# Patient Record
Sex: Male | Born: 1980 | Race: White | Hispanic: No | Marital: Single
Health system: Midwestern US, Community
[De-identification: ages and names within clinical notes are randomized; demographics above are authoritative.]

## PROBLEM LIST (undated history)

## (undated) DIAGNOSIS — C73 Malignant neoplasm of thyroid gland: Secondary | ICD-10-CM

## (undated) DIAGNOSIS — E890 Postprocedural hypothyroidism: Secondary | ICD-10-CM

## (undated) HISTORY — PX: THYROID SURGERY: SHX805

---

## 2013-05-08 MED ORDER — LISINOPRIL 10 MG TAB
10 mg | ORAL_TABLET | Freq: Every day | ORAL | Status: DC
Start: 2013-05-08 — End: 2014-03-31

## 2013-05-09 NOTE — Progress Notes (Signed)
HISTORY OF PRESENT ILLNESS  Kevin Ellis is a 32 y.o. male.  Hypertension   The history is provided by the patient. This is a chronic problem. Episode onset: was dx with uncontrolled htn at the [facility] about 4 wks ago. The problem has not changed since onset.Associated symptoms include chest pain, palpitations, anxiety, headaches, peripheral edema, dizziness, nausea and vomiting. Pertinent negatives include no orthopnea, no PND, no confusion, no malaise/fatigue, no blurred vision, no neck pain and no shortness of breath. There are no associated agents to hypertension. Risk factors include family history, male gender and stress (x of substance abuse; claims to be clean for the past 7 mos;; going  thru rehab with suboxane).   Dizziness   The history is provided by the patient. This is a new problem. Episode onset: had had dizzines for the past 3 mo.last for 5-6 secs and clears spontaneously. Episode frequency: recurrent. The problem has not changed since onset.There was no loss of consciousness. The problem is associated with standing up, turning head, exertion and sitting up. Associated symptoms include chest pain, palpitations, nausea, vomiting, headaches and dizziness. Pertinent negatives include no clumsiness, no confusion, no diaphoresis, no fever, no malaise/fatigue, no abdominal pain, no bowel incontinence, no bladder incontinence, no congestion, no back pain, no seizures, no slurred speech, no weakness and no anal bleeding. He has tried nothing for the symptoms. The treatment provided no relief. His past medical history is significant for HTN. His past medical history does not include no CVA, no TIA, no CAD, no DM, no vertigo, no seizures, no syncope or no AICD. Past medical history comments: hx sunstance abuse.   Thyroid Problem  The history is provided by the patient. This is a chronic problem. Episode onset: since 2006 when he was found to hve CA OF THYROID GLANF; s/p partial thyroidectomy.  Associated symptoms include chest pain and headaches. Pertinent negatives include no abdominal pain and no shortness of breath. Associated symptoms comments: No energy, malaise. Exacerbated by: has had no meds or f/u  for 4-5 yrs. Nothing relieves the symptoms. He has tried nothing for the symptoms. The treatment provided no relief.   Headache  The history is provided by the patient. This is a chronic problem. Episode onset: HAS HAD MIGRAINE H/A AND TENSION H/A FOR A FEW YRS. Episode frequency: RECURRENT. The problem has not changed since onset.Associated symptoms include chest pain and headaches. Pertinent negatives include no abdominal pain and no shortness of breath. Associated symptoms comments: NAUSEA AND VOMITING;BLURRING OF VISION PHOTOPHOBIA, MALAISE. The symptoms are aggravated by stress and exertion. The symptoms are relieved by medications. Treatments tried: RX MEDS. The treatment provided moderate relief.   Addiction problem  Primary symptoms include: agitation.  There areno confusion, no somnolence, no loss of consciousness, no seizures, no weakness, no delusions, no hallucinations, no self-injury, no violence and no intoxication present at this time.  The history is provided by the patient. This is a chronic problem. Episode onset: 10 YR. 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 Associated symptoms include nausea and vomiting. Pertinent negatives include no fever, no bladder incontinence and no bowel incontinence. Associated symptoms comments: bein g followed up at the [facility] where he is given Suboxone Going thrU ANA. Associated medical issues include addiction treatment, withdrawal syndrome and depression. Associated medical issues  do not include mental illness, psychiatric history,  recent illness, recent infection, suicidal ideas, homicidal ideas and mental status change.   ED Follow-up  The history is provided by the patient. This is a new problem. Episode onset: SEEN AT THE ED 1 WK AGO FOR CHEST PAINS, HEADACHES  AND NUMBNESS OF THE FINGERS OF THE LEFT HAND. The problem has been resolved (sx cleared spontaneously after 1 day;work up at the ED incl ct of head and blood work were unremarkable). Associated symptoms include chest pain and headaches. Pertinent negatives include no abdominal pain and no shortness of breath. The symptoms are aggravated by stress. The symptoms are relieved by medications (AT THE ED). He has tried nothing for the symptoms. The treatment provided no relief.   Other  The history is provided by the patient. Chronicity: RECENT TEST AT THE [facility] SHOWED + FOR HEP C , NEG FOR HIV AND HEP C  ; HAD RECENT HEP B VACCINATION. The problem has not changed since onset.Associated symptoms include chest pain and headaches. Pertinent negatives include no abdominal pain and no shortness of breath. Associated symptoms comments: NEG FOR FEVERS , JAUNDICE;OR CONSTITUTIONAL SX. Nothing aggravates the symptoms. Nothing relieves the symptoms. He has tried nothing for the symptoms. The treatment provided no relief.       Review of Systems   Constitutional: Negative for fever, weight loss, malaise/fatigue and diaphoresis.   HENT: Negative for hearing loss, nosebleeds, congestion, sore throat, neck pain and ear discharge.    Eyes: Positive for photophobia. Negative for blurred vision, double vision, discharge and redness.   Respiratory: Negative for cough, hemoptysis, shortness of breath and wheezing.    Cardiovascular: Positive for chest pain and palpitations. Negative for orthopnea, claudication, leg swelling and PND.   Gastrointestinal: Positive for heartburn, nausea, vomiting and constipation. Negative for abdominal pain, diarrhea, anal bleeding and bowel incontinence.   Genitourinary:  Negative.  Negative for bladder incontinence.   Musculoskeletal: Negative for myalgias, back pain and joint pain.   Skin: Negative.    Neurological: Positive for dizziness and headaches. Negative for tingling, seizures, loss of consciousness, syncope and weakness.   Endo/Heme/Allergies: Positive for environmental allergies. Negative for polydipsia. Does not bruise/bleed easily.   Psychiatric/Behavioral: Positive for depression, substance abuse and agitation. Negative for suicidal ideas, homicidal ideas, hallucinations, confusion and self-injury. The patient is nervous/anxious.      Past Medical History   Diagnosis Date   ??? Thyroid cancer 2006     Past Surgical History   Procedure Laterality Date   ??? Hx partial thyroidectomy  2006     Family History   Problem Relation Age of Onset   ??? Diabetes Mother    ??? Heart Disease Father        Not on File  No current outpatient prescriptions on file prior to visit.     No current facility-administered medications on file prior to visit.     Patient Active Problem List   Diagnosis Code   ??? Thyroid ca 193   ??? Substance abuse 305.90       Filed Vitals:    05/08/13 1521   BP: 110/90   Pulse: 63   Temp: 98.1 ??F (36.7 ??C)   TempSrc: Oral   Resp: 20   Height: 6\' 1"  (1.854 m)   Weight: 179 lb (81.194 kg)   SpO2: 98%   PainSc:   0 - No pain     Body mass index is 23.62 kg/(m^2).  Physical Exam   Constitutional: He is oriented to person, place, and time. He appears well-developed and well-nourished. No distress.   HENT:   Head: Normocephalic and atraumatic.   Right Ear: External ear normal.   Left Ear: External ear normal.   Nose: Nose normal.   Mouth/Throat: No oropharyngeal exudate.   Eyes: Conjunctivae and EOM are normal. Pupils are equal, round, and reactive to light. Right eye exhibits no discharge. Left eye exhibits no discharge.   Neck: Normal range of motion. Neck supple. No JVD present. No tracheal deviation present. No thyromegaly present.   Cardiovascular: Normal rate,  regular rhythm, normal heart sounds and intact distal pulses.  Exam reveals no gallop and no friction rub.    No murmur heard.  Pulmonary/Chest: Effort normal and breath sounds normal. No stridor. No respiratory distress. He has no wheezes. He has no rales. He exhibits no tenderness.   Abdominal: Soft. Bowel sounds are normal. He exhibits no distension.   Musculoskeletal: Normal range of motion. He exhibits no edema and no tenderness.   Lymphadenopathy:     He has no cervical adenopathy.   Neurological: He is alert and oriented to person, place, and time. He has normal reflexes. He displays normal reflexes. No cranial nerve deficit. Coordination normal.   Skin: Skin is warm. No rash noted. No erythema.   Psychiatric: His behavior is normal. Judgment and thought content normal.   CURRR[facility]LY ON [facility] FROM [facility]; HX OF SUBSTANCE ABUSE BUT CLAIMS HE HAD BEEN CLEAN FOR AT LEAST 7 MOS;ANXOIUS WITH FLAT AFFECT          ASSESSMENT and PLAN  Encounter Diagnoses   Name Primary?   ??? Hypertension Yes   ??? Hyperlipidemia    ??? Thyroid ca    ??? Anemia    ??? Hepatitis B      Orders Placed This Encounter   ??? NM THYROID SCAN W OR WO FLOW   ??? LIPID PANEL   ??? METABOLIC PANEL, COMPREHENSIVE   ??? THYROID PANEL W/TSH   ??? CBC WITH AUTOMATED DIFF   ??? HEP B SURFACE AG   ??? HEPATITIS B CORE AB, TOTAL   ??? buprenorphine-naloxone (SUBOXONE) 8-2 mg subl   ??? lisinopril (PRINIVIL, ZESTRIL) 10 mg tablet     Follow-up Disposition:  Return in about 3 weeks (around 05/29/2013).  current treatment plan is effective, no change in therapy  lab results and schedule of future lab studies reviewed with patient  cardiovascular risk and specific lipid/LDL goals reviewed  reviewed medications and side effects in detail  radiology results and schedule of future radiology studies reviewed with patient

## 2013-05-31 LAB — METABOLIC PANEL, COMPREHENSIVE
A-G Ratio: 1.1 — ABNORMAL LOW (ref 1.2–2.2)
ALT (SGPT): 19 U/L (ref 12–78)
AST (SGOT): 20 U/L (ref 15–37)
Albumin: 4 g/dL (ref 3.4–5.0)
Alk. phosphatase: 61 U/L (ref 50–136)
Anion gap: 4 mmol/L — ABNORMAL LOW (ref 6–15)
BUN/Creatinine ratio: 13 (ref 7–25)
BUN: 12 MG/DL (ref 7–18)
Bilirubin, total: 0.6 MG/DL (ref <1.1)
CO2: 33 mmol/L — ABNORMAL HIGH (ref 21–32)
Calcium: 9.2 MG/DL (ref 8.5–10.1)
Chloride: 104 mmol/L (ref 98–107)
Creatinine: 0.9 MG/DL (ref 0.60–1.30)
GFR est AA: >60 mL/min/{1.73_m2} (ref >60)
GFR est non-AA: >60 mL/min/{1.73_m2} (ref >60)
Globulin: 3.7 g/dL — ABNORMAL HIGH (ref 2.4–3.5)
Glucose: 90 mg/dL (ref 70–110)
Potassium: 3.7 mmol/L (ref 3.5–5.3)
Protein, total: 7.7 g/dL (ref 6.4–8.2)
Sodium: 141 mmol/L (ref 136–145)

## 2013-05-31 LAB — HEP B SURFACE AG: Hep B Surface Ag: NONREACTIVE

## 2013-05-31 LAB — CBC WITH AUTOMATED DIFF
ABS. BASOPHILS: 0 10*3/uL (ref 0.0–0.1)
ABS. EOSINOPHILS: 0.1 10*3/uL (ref 0.0–0.5)
ABS. LYMPHOCYTES: 2.5 10*3/uL (ref 0.8–3.5)
ABS. MONOCYTES: 0.3 10*3/uL — ABNORMAL LOW (ref 0.8–3.5)
ABS. NEUTROPHILS: 1.9 10*3/uL (ref 1.5–8.0)
BASOPHILS: 0 % (ref 0–2)
EOSINOPHILS: 2 % (ref 0–5)
HCT: 39.1 % — ABNORMAL LOW (ref 41–53)
HGB: 13.3 g/dL — ABNORMAL LOW (ref 13.5–17.5)
LYMPHOCYTES: 53 % — ABNORMAL HIGH (ref 19–48)
MCH: 28.6 PG (ref 27–31)
MCHC: 34 g/dL (ref 31–37)
MCV: 84.1 FL (ref 78–98)
MONOCYTES: 5 % (ref 3–9)
MPV: 11 FL — ABNORMAL HIGH (ref 5.9–10.3)
NEUTROPHILS: 40 % (ref 40–74)
PLATELET: 201 10*3/uL (ref 130–400)
RBC: 4.65 M/uL — ABNORMAL LOW (ref 4.7–6.1)
RDW: 12.6 % (ref 11.5–14.5)
WBC: 4.8 10*3/uL (ref 4.5–10.8)

## 2013-05-31 LAB — THYROID PANEL W/TSH
Free thyroxine index: 2.4 (ref 1.4–5.2)
T3 Uptake: 36 % (ref 31–39)
T4, Total: 6.7 ug/dL (ref 4.7–13.3)
TSH: 3.9 u[IU]/mL — ABNORMAL HIGH (ref 0.35–3.74)

## 2013-05-31 LAB — LIPID PANEL
Cholesterol, total: 124 MG/DL (ref <200)
HDL Cholesterol: 40 MG/DL (ref 32–96)
LDL, calculated: 68.64 MG/DL (ref <130)
Triglyceride: 96 MG/DL (ref <150)
VLDL, calculated: 19.2 MG/DL (ref 5–32)

## 2013-05-31 NOTE — Progress Notes (Signed)
Kevin Ellis had labs drawn today per Dr. Doneta Public.

## 2013-06-01 LAB — HEPATITIS B CORE ANTIBODY, TOTAL: Hep B Core Total Ab: NEGATIVE

## 2013-06-01 LAB — HEPATITIS B CORE AB, TOTAL: Hep B Core Ab, total: NEGATIVE

## 2013-06-05 MED ORDER — AMLODIPINE 5 MG TAB
5 mg | ORAL_TABLET | Freq: Every day | ORAL | Status: DC
Start: 2013-06-05 — End: 2014-03-31

## 2013-06-05 MED ORDER — LEVOTHYROXINE 75 MCG TAB
75 mcg | ORAL_TABLET | Freq: Every day | ORAL | Status: DC
Start: 2013-06-05 — End: 2014-03-31

## 2013-06-05 MED ORDER — FERROUS SULFATE 325 MG (65 MG ELEMENTAL IRON) TAB
325 mg (65 mg iron) | ORAL_TABLET | Freq: Every day | ORAL | Status: DC
Start: 2013-06-05 — End: 2014-03-31

## 2013-06-06 NOTE — Progress Notes (Signed)
HISTORY OF PRESENT ILLNESS  Kevin Ellis is a 32 y.o. male.  HPI Comments: DISCUSSED BLOOD TESTS DONE ON 05/31/13- HEMATOLOGY - SL LOW HGB-13.3 GM AND HCT-39%;            CHEMISTRY- SERUM ELECTROLYTES-NORMAL, GLUCOSE, BUN/CREATININE, GFRNA-NORMAL;                  LIVER ENZYMES- NORMAL; LIPID PANEL- NORMAL;THYROID PROFILE SL LOW TSH    Results  Pertinent negatives include no chest pain, no abdominal pain, no headaches and no shortness of breath.   Hypertension   The history is provided by the patient. This is a chronic problem. Episode onset: HAS HAD HTN FOR A FEW YRS, Progression since onset: IN SPITE OF TAKING HIS MED REGULARLY, HIS BP HAD BEEN UNCONTROLLED. Associated symptoms include anxiety and malaise/fatigue. Pertinent negatives include no chest pain, no orthopnea, no palpitations, no PND, no confusion, no headaches, no tinnitus, no neck pain, no peripheral edema, no dizziness, no shortness of breath, no nausea and no vomiting. There are no associated agents to hypertension. Risk factors include male gender and stress (HJXOF SUBSTANCE ABUSE BUT CLAIMS TO BE CLEAN FOR  A FEW MOS. ; CURRENTLY ON DETOXIFICATION WITH SUBOXONE).   Addiction problem  Primary symptoms include: weakness and agitation.  There areno confusion, no somnolence, no loss of consciousness, no seizures, no delusions, no hallucinations, no violence and no intoxication present at this time.  The history is provided by the patient. This is a chronic problem. Episode onset: HAS HX OF SUBSTANCE ABUSE WITH NARCOTICS  AND ILLEGAL DRUGS  BUT  CURRENTLY ON COUNSELING AND DETOXIFICAITION AND ADMITS TO HAVE BEEN CLEAN FOR A FEW MOS. The problem has been gradually improving. Suspected agents include alcohol, hallucinogens, heroin, opiates, prescription drugs and crack. Pertinent negatives include no fever, no injury, no nausea, no vomiting, no bladder incontinence and no bowel incontinence. Associated medical issues include addiction treatment. Associated  medical issues do not include mental illness, psychiatric history, recent illness, recent infection, suicidal ideas, homicidal ideas, mental status change and depression.   Thyroid Problem  The history is provided by the patient. This is a chronic problem. 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 Episode frequency: HAS HAD RECURRENT  WEAKNESS, LETHARGY FOR A FEW MOS. The problem has not changed since onset.Pertinent negatives include no chest pain, no abdominal pain, no headaches and no shortness of breath. Associated symptoms comments: EASY FATIGABILITY, LETHARGY, COLD INTOLERANCE. The symptoms are aggravated by stress. Nothing relieves the symptoms. He has tried nothing for the symptoms. The treatment provided no relief.       Review of Systems   Constitutional: Positive for malaise/fatigue. Negative for fever, weight loss and diaphoresis.   HENT: Negative for hearing loss, congestion, sore throat, neck pain and tinnitus.    Eyes: Negative.    Respiratory: Negative for cough, hemoptysis, shortness of breath and wheezing.    Cardiovascular: Negative for chest pain, palpitations, orthopnea, claudication and PND.   Gastrointestinal: Positive for heartburn. Negative for nausea, vomiting, abdominal pain, diarrhea, constipation, blood in stool and bowel incontinence.   Genitourinary: Negative.  Negative for bladder incontinence.   Musculoskeletal: Negative for myalgias, back pain and joint pain.   Skin: Negative.    Neurological: Positive for weakness. Negative for dizziness, tingling, tremors, focal weakness, seizures, loss of consciousness and headaches.  Endo/Heme/Allergies: Negative for environmental allergies and polydipsia. Does not bruise/bleed easily.   Psychiatric/Behavioral: Positive for substance abuse and agitation. Negative for depression, suicidal ideas,  homicidal ideas, hallucinations and confusion. The patient is nervous/anxious. The patient does not have insomnia.         HX OF SUBSTANCE ABUSE; ON COUNSELING AND DETOXIFICATION WITH SUBOXONE     Past Medical History   Diagnosis Date   ??? Thyroid cancer 2006     Past Surgical History   Procedure Laterality Date   ??? Hx partial thyroidectomy  2006     Family History   Problem Relation Age of Onset   ??? Diabetes Mother    ??? Heart Disease Father        Not on File  Current Outpatient Prescriptions on File Prior to Visit   Medication Sig Dispense Refill   ??? buprenorphine-naloxone (SUBOXONE) 8-2 mg subl by SubLINGual route daily.       ??? lisinopril (PRINIVIL, ZESTRIL) 10 mg tablet Take 1 Tab by mouth daily.  30 Tab  1     No current facility-administered medications on file prior to visit.     Patient Active Problem List   Diagnosis Code   ??? Thyroid ca 193   ??? Substance abuse 305.90       Filed Vitals:    06/05/13 1520   BP: 120/90   Pulse: 58   Temp: 98.7 ??F (37.1 ??C)   TempSrc: Oral   Resp: 18   Height: 6\' 1"  (1.854 m)   Weight: 182 lb (82.555 kg)   SpO2: 98%   PainSc:   0 - No pain     Body mass index is 24.02 kg/(m^2).        Physical Exam   Constitutional: He is oriented to person, place, and time. He appears well-developed and well-nourished. No distress.   HENT:   Right Ear: External ear normal.   Left Ear: External ear normal.   Nose: Nose normal.   Mouth/Throat: No oropharyngeal exudate.   Eyes: Conjunctivae and EOM are normal. Pupils are equal, round, and reactive to light. Right eye exhibits no discharge. Left eye exhibits no discharge. No scleral icterus.   Neck: Normal range of motion. Neck supple. No JVD present. No tracheal deviation present. No thyromegaly present.   Cardiovascular: Normal rate, regular rhythm, normal heart sounds and intact distal pulses.  Exam reveals no gallop and no friction rub.    No murmur heard.  Pulmonary/Chest: Effort normal and breath sounds normal. No stridor. No respiratory  distress. He has no wheezes. He has no rales. He exhibits no tenderness.   Abdominal: Soft. Bowel sounds are normal. He exhibits no distension. There is no tenderness. There is no guarding.   Musculoskeletal: Normal range of motion. He exhibits no edema and no tenderness.   Lymphadenopathy:     He has no cervical adenopathy.   Neurological: He is alert and oriented to person, place, and time. He has normal reflexes. He displays normal reflexes. No cranial nerve deficit. He exhibits normal muscle tone. Coordination normal.   Skin: Skin is warm. No rash noted. No erythema.   Psychiatric: His behavior is normal. Judgment and thought content normal.   ANXIOUS WITH FLAT SFFECT       ASSESSMENT and PLAN  Encounter Diagnoses   Name Primary?   ??? Hypertension Yes   ??? Anemia    ??? Hypothyroidism    ??? Substance abuse      Orders Placed  This Encounter   ??? amLODIPine (NORVASC) 5 mg tablet   ??? levothyroxine (SYNTHROID) 75 mcg tablet   ??? ferrous sulfate 325 mg (65 mg iron) tablet     Follow-up Disposition:  Return in about 4 weeks (around 07/03/2013).  current treatment plan is effective, no change in therapy  the following changes in treatment are made: STARTED ON THYROID MEDS AND ADDED ON NORVASC TO HIS BP REGIMEN  lab results and schedule of future lab studies reviewed with patient  cardiovascular risk and specific lipid/LDL goals reviewed  reviewed medications and side effects in detail

## 2013-06-26 NOTE — Telephone Encounter (Signed)
Patient's mother is requesting a prescription for head lice.

## 2013-06-27 MED ORDER — PERMETHRIN 1 % TOPICAL LIQUID
1 % | CUTANEOUS | Status: AC
Start: 2013-06-27 — End: 2013-06-29

## 2013-07-24 NOTE — Progress Notes (Signed)
Letter was sent notifying patient of overdue results on 07/24/2013.

## 2013-08-08 MED ORDER — PENICILLIN V-K 250 MG TAB
250 mg | ORAL | Status: AC
Start: 2013-08-08 — End: 2013-08-08
  Administered 2013-08-08: 13:00:00

## 2013-08-08 MED ORDER — PENICILLIN V-K 250 MG TAB
250 mg | Freq: Four times a day (QID) | ORAL | Status: DC
Start: 2013-08-08 — End: 2013-08-08

## 2013-08-08 MED ORDER — OXYCODONE-ACETAMINOPHEN 5 MG-325 MG TAB
5-325 mg | ORAL | Status: AC
Start: 2013-08-08 — End: 2013-08-08
  Administered 2013-08-08: 13:00:00 via ORAL

## 2013-08-08 MED ORDER — OXYCODONE-ACETAMINOPHEN 5 MG-325 MG TAB
5-325 mg | ORAL_TABLET | Freq: Four times a day (QID) | ORAL | Status: DC | PRN
Start: 2013-08-08 — End: 2014-03-31

## 2013-08-08 MED ORDER — PENICILLIN V-K 500 MG TAB
500 mg | ORAL_TABLET | Freq: Four times a day (QID) | ORAL | Status: DC
Start: 2013-08-08 — End: 2013-08-08

## 2013-08-08 MED ORDER — PENICILLIN V-K 500 MG TAB
500 mg | ORAL_TABLET | Freq: Four times a day (QID) | ORAL | Status: AC
Start: 2013-08-08 — End: 2013-08-15

## 2013-08-08 NOTE — ED Notes (Signed)
Pt evaluated and treated by Dr. Baxter Flattery, gave pt d/c instructions and patient gave a verbal understanding and will FU with PMD, pt left ED with steady gait.

## 2013-08-08 NOTE — ED Provider Notes (Signed)
The history is provided by the patient.   8:17 AM Kevin Ellis is a 32 y.o. male with a h/o Thyroid cancer presents to ED c/o dental pain onset 5 days. Pt is in [place] for work (from [place]) and presented to ED due to pain and states he will follow up with his Orthodontist back at home. Pt denies fever, chills, or any other sx at this time.        Past Medical History   Diagnosis Date   ??? Thyroid cancer 2006        Past Surgical History   Procedure Laterality Date   ??? Hx partial thyroidectomy  2006         Family History   Problem Relation Age of Onset   ??? Diabetes Mother    ??? Heart Disease Father         History     Social History   ??? Marital Status: SINGLE     Spouse Name: N/A     Number of Children: N/A   ??? Years of Education: N/A     Occupational History   ??? Not on file.     Social History Main Topics   ??? Smoking status: Never Smoker    ??? Smokeless tobacco: Never Used   ??? Alcohol Use: No   ??? Drug Use: Not on file   ??? Sexually Active: Not on file     Other Topics Concern   ??? Not on file     Social History Narrative   ??? No narrative on file                  ALLERGIES: Review of patient's allergies indicates no known allergies.      Review of Systems   Constitutional: Negative for fever and chills.   HENT: Negative for neck pain and neck stiffness.         Right upper molar pain   Eyes: Negative for photophobia and visual disturbance.   Respiratory: Negative for cough and shortness of breath.    Cardiovascular: Negative for chest pain and leg swelling.   Gastrointestinal: Negative for nausea, vomiting, abdominal pain and diarrhea.   Genitourinary: Negative for dysuria and hematuria.   Musculoskeletal: Negative for myalgias and back pain.   Skin: Negative for color change and pallor.   Neurological: Negative for seizures and syncope.       Filed Vitals:    08/08/13 0752   BP: 165/97   Pulse: 60   Temp: 98.2 ??F (36.8 ??C)   Resp: 16   Height: 6\' 1"  (1.854 m)   Weight: 81.647 kg (180 lb)   SpO2: 100%   8:33 AM Pulse  Oximetry reading is 100 % on room air, which indicates normal oxygenation at triage per Malachi Carl, MD           Physical Exam   Nursing note and vitals reviewed.  Constitutional: He is oriented to person, place, and time. He appears well-developed and well-nourished. No distress.   HENT:   Head: Normocephalic and atraumatic.   Severe upper right molar pain, bad cavity   Eyes: Conjunctivae are normal. No scleral icterus.   Cardiovascular: Normal rate, regular rhythm and normal heart sounds.    Pulmonary/Chest: Effort normal and breath sounds normal. No respiratory distress. He has no wheezes. He has no rales.   Abdominal: Soft. Bowel sounds are normal. He exhibits no distension. There is no tenderness. There is no rebound and no  guarding.   Musculoskeletal: Normal range of motion. He exhibits no edema and no tenderness.   Neurological: He is alert and oriented to person, place, and time.   Skin: Skin is warm and dry.        MDM     Amount and/or Complexity of Data Reviewed:    Decide to obtain previous medical records or to obtain history from someone other than the patient:  Yes   Review and summarize past medical records:  Yes   Independant visualization of image, tracing, or specimen:  Yes      Procedures    <EMERGENCY DEPARTMENT CASE SUMMARY>    Impression/Differential Diagnosis: Toothache, Dental caries    ED Course:   Veetid and Percocet ordered.  8:34 AM Patient was reassessed prior to discharge. Discussed results, diagnosis, plan, and discharge instructions in person. Rx for Percocet and Veetid given. iStop not consulted because pt is getting less than 5 day supply or 20 doses of medications as defined by the [facility] prescription monitoring program. Patient's questions were answered. Patient advised to follow up with PMD and/or specialist. Patient instructed to return to the ER if symptoms persist, change, or worsen. Patient agrees with plan.     Final Impression/Diagnosis:   1. Dental caries          Patient condition at time of disposition: Stable      I have reviewed the following home medications:    Prior to Admission medications    Medication Sig Start Date End Date Taking? Authorizing Provider   oxycodone-acetaminophen (PERCOCET) 5-325 mg per tablet Take 2 tablets by mouth every six (6) hours as needed for Pain. 08/08/13  Yes Malachi Carl, MD   penicillin v potassium (VEETID) 500 mg tablet Take 1 tablet by mouth four (4) times daily for 7 days. 08/08/13 08/15/13 Yes Malachi Carl, MD   levothyroxine (SYNTHROID) 75 mcg tablet Take 1 Tab by mouth Daily (before breakfast). 06/05/13  Yes Portia Canos V, MD   ferrous sulfate 325 mg (65 mg iron) tablet Take 1 Tab by mouth Daily (before breakfast). 06/05/13  Yes Portia Canos V, MD   amLODIPine (NORVASC) 5 mg tablet Take 1 Tab by mouth daily. 06/05/13   Portia Canos V, MD   buprenorphine-naloxone (SUBOXONE) 8-2 mg subl by SubLINGual route daily.    Historical Provider   lisinopril (PRINIVIL, ZESTRIL) 10 mg tablet Take 1 Tab by mouth daily. 05/08/13   Sylvan Cheese, MD       Malachi Carl, MD     I, Sun-Woong Pandora Leiter, am serving as a scribe to document services personally performed by Malachi Carl, MD based on my observation and the provider's statements to me.    I attest the person(s) noted above, acting as my scribe(s), has/have observed my performance of the services and has documented them in accordance with my direction. I have personally reviewed the above information and have ordered and reviewed the diagnostic studies, unless otherwise noted.    Malachi Carl, MD

## 2013-08-08 NOTE — ED Notes (Signed)
Toothache for few days.

## 2014-03-31 MED ORDER — AMLODIPINE 5 MG TAB
5 mg | ORAL_TABLET | Freq: Every day | ORAL | Status: DC
Start: 2014-03-31 — End: 2014-12-11

## 2014-03-31 MED ORDER — LEVOTHYROXINE 75 MCG TAB
75 mcg | ORAL_TABLET | Freq: Every day | ORAL | Status: DC
Start: 2014-03-31 — End: 2014-12-17

## 2014-03-31 MED ORDER — LISINOPRIL 10 MG TAB
10 mg | ORAL_TABLET | Freq: Every day | ORAL | Status: DC
Start: 2014-03-31 — End: 2014-12-11

## 2014-03-31 MED ORDER — FERROUS SULFATE 325 MG (65 MG ELEMENTAL IRON) TAB
325 mg (65 mg iron) | ORAL_TABLET | Freq: Every day | ORAL | Status: DC
Start: 2014-03-31 — End: 2014-12-11

## 2014-04-01 NOTE — Progress Notes (Signed)
HISTORY OF PRESENT ILLNESS  Kevin Ellis is a 33 y.o. male.  Hypertension   The history is provided by the patient. This is a chronic problem. Episode onset: HAS HAD HTN FOR A FEW YRS. Progression since onset: HAD BEEN OUT OF MEDS FOR ABOUT 2 MOS; WORKS OUT OF TOWN  SO UNABE TO GO TO THE OFFICE FOR REFILLS AND F/U. Associated symptoms include palpitations, anxiety, malaise/fatigue, dizziness and shortness of breath. There are no associated agents to hypertension. Risk factors include male gender and stress (HX OF ANEMIA, DRUG ABUSE, CA OF THYROID GLAND  2006).   Anemia  The history is provided by the patient. This is a chronic problem. Episode onset: HX OF ANEMIA FOR SEVERAL YRS HAD NOTBEEN TAKING HIS IRON PILLS FOR  A FEW YRS. The problem has not changed since onset.Associated symptoms include shortness of breath. Associated symptoms comments: WEAKNESS, NO ENERGY, MALAISE, EASY FATIGABILITY. The symptoms are aggravated by stress (POOR APPETITE ABD POOR INTAKE). Nothing relieves the symptoms. He has tried nothing for the symptoms. The treatment provided no relief.   Weight Loss  The history is provided by the patient. This is a chronic problem. Episode onset: HAD BEEN LOSING WT FOR 2-3 MOS UPTO 20 LBS. The problem has not changed since onset.Associated symptoms include shortness of breath. Associated symptoms comments: POOR APPETITE WITH POOR INTAKE, SKIPPING MEALS; NO NAUSEA/VOMITING, ,  DIARRHEA,  FEVERS; NEG FOR ABDOMINAL PAINS RESP SX EXCEPT FOR COUGHING AND URINARY SX. The symptoms are aggravated by stress (POOR EATING HABITS). Nothing relieves the symptoms. He has tried nothing for the symptoms. The treatment provided no relief.   Addiction problem  Primary symptoms include: weakness.  There areno loss of consciousness, no seizures and no hallucinations present at this time.  The history is provided by the patient. This is a chronic problem. Episode onset: HX OF  DRUG ABUSE FOR ABOUT 10 YRS ; SNORTED  COCAINE; SMOKED MARIJUANA, TOOK  OPIATES, HEROIN. Progression since onset: ON REHAB FOR 18 MOS WITH COUNSELING AND ON SUBOXANE IN WHICH HE IS STILL CURRENTLY  ON;GOES TO [facility]  IN [place] . [place] FOR HIS REHAB,; CLAIMS TO BE CLEAN SINCE STARTED ON [place]  Suspected agents include cocaine, heroin, opiates and prescription drugs. Pertinent negatives include no fever. Associated medical issues include addiction treatment, withdrawal syndrome, mental status change and depression. Associated medical issues do not include suicidal ideas.   Other  The history is provided by the patient. Episode onset: HX OF THYROID CA S/P PARTIAL THYROIDECTOMY IN 2006. Progression since onset: SEEMED TO HAVE BEEN RESOLVED WITH SURGERY. Associated symptoms include shortness of breath. Associated symptoms comments: CURRENT PROBLEM OF WT LOSS; WEAKNESS, EASY FATIGABILITY, NO ENERGY, ANXIETY. Nothing relieves the symptoms. Treatments tried: USED TO BE ON THYROID PILLS BUT HAD BEEN OFF FOR ABOUT 3 MOS.        Review of Systems   Constitutional: Positive for weight loss and malaise/fatigue. Negative for fever and chills.        ANOREXIA WITH POOR FOOD INTAKE   HENT: Negative.    Eyes: Negative.    Respiratory: Positive for cough and shortness of breath. Negative for sputum production and wheezing.    Cardiovascular: Positive for palpitations.   Gastrointestinal: Negative.    Genitourinary: Negative.    Musculoskeletal: Negative.    Skin: Negative.    Neurological: Positive for dizziness and weakness. Negative for tingling, tremors, focal weakness, seizures and loss of consciousness.   Endo/Heme/Allergies: Positive for environmental  allergies. Negative for polydipsia. Does not bruise/bleed easily.   Psychiatric/Behavioral: Positive for depression and substance abuse. Negative for suicidal ideas and hallucinations. The patient is nervous/anxious and has insomnia.      Past Medical History   Diagnosis Date   ??? Thyroid cancer ([place]) 2006     Past  Surgical History   Procedure Laterality Date   ??? Hx partial thyroidectomy  2006     Family History   Problem Relation Age of Onset   ??? Diabetes Mother    ??? Heart Disease Father        No Known Allergies  Current Outpatient Prescriptions on File Prior to Visit   Medication Sig Dispense Refill   ??? buprenorphine-naloxone (SUBOXONE) 8-2 mg subl by SubLINGual route daily.         No current facility-administered medications on file prior to visit.     Patient Active Problem List   Diagnosis Code   ??? Thyroid ca ([place]) 193   ??? Substance abuse 305.90       Filed Vitals:    03/31/14 1500   BP: 125/85   Pulse: 71   Temp: 98 ??F (36.7 ??C)   TempSrc: Oral   Resp: 18   Height: 6\' 1"  (1.854 m)   Weight: 162 lb 6.4 oz (73.664 kg)   SpO2: 98%   PainSc:   0 - No pain     Body mass index is 21.43 kg/(m^2).        Physical Exam   Constitutional: He is oriented to person, place, and time. He appears well-developed. No distress.   APPEARS GAUNT AND MALNOURISHED   HENT:   Right Ear: External ear normal.   Left Ear: External ear normal.   Nose: Nose normal.   Mouth/Throat: No oropharyngeal exudate.   Eyes: Conjunctivae and EOM are normal. Pupils are equal, round, and reactive to light. Right eye exhibits no discharge. Left eye exhibits no discharge. No scleral icterus.   Neck: Normal range of motion. Neck supple. No JVD present. No tracheal deviation present. No thyromegaly present.   S/P PARTIAL THYROIDECTOMY ; RT LOBE SL NODULARITY AND PALPABLE; NO SIGNIFICANT CERVICAL LYMPHADENOPATHY   Cardiovascular: Normal rate, regular rhythm, normal heart sounds and intact distal pulses.  Exam reveals no gallop.    No murmur heard.  Pulmonary/Chest: Effort normal and breath sounds normal. No respiratory distress. He has no wheezes. He has no rales.   Abdominal: Soft. Bowel sounds are normal. He exhibits no distension and no mass. There is no tenderness. There is no guarding.   Musculoskeletal: Normal range of motion. He exhibits no edema or  tenderness.   Lymphadenopathy:     He has no cervical adenopathy.   Neurological: He is alert and oriented to person, place, and time. He has normal reflexes. He displays normal reflexes. No cranial nerve deficit. He exhibits normal muscle tone. Coordination normal.   Skin: Skin is warm and dry. No erythema.   LOSS OF TURGOR   Psychiatric: His behavior is normal. Judgment and thought content normal.   ANXIOUS WITH FLAT AFFECT       ASSESSMENT and PLAN  Encounter Diagnoses   Name Primary?   ??? Essential hypertension Yes   ??? Thyroid ca [facility])    ??? Substance abuse    ??? Postoperative hypothyroidism    ??? Weight loss, unintentional    ??? Drug abuse in remission    ??? Other iron deficiency anemias    ??? Iron deficiency anemia    ???  Hyperlipidemia    ??? Chronic cough      Orders Placed This Encounter   ??? XR CHEST PA LAT   ??? NM THYROID SCAN W OR WO FLOW   ??? LIPID PANEL   ??? METABOLIC PANEL, COMPREHENSIVE   ??? THYROID PANEL W/TSH   ??? HEPATITIS C QT BY PCR WITH REFLEX GENOTYPE   ??? HIV-1 RNA QT BY PCR   ??? CBC WITH AUTOMATED DIFF   ??? FERRITIN   ??? VITAMIN B12   ??? FOLATE   ??? IRON PROFILE   ??? RETICULOCYTE COUNT   ??? 9 - DRUG SCREEN, W/ CONFIRM   ??? amLODIPine (NORVASC) 5 mg tablet   ??? levothyroxine (SYNTHROID) 75 mcg tablet   ??? ferrous sulfate 325 mg (65 mg iron) tablet   ??? lisinopril (PRINIVIL, ZESTRIL) 10 mg tablet     Follow-up Disposition:  Return in about 2 weeks (around 04/14/2014).  current treatment plan is effective, no change in therapy  lab results and schedule of future lab studies reviewed with patient  cardiovascular risk and specific lipid/LDL goals reviewed  reviewed medications and side effects in detail  radiology results and schedule of future radiology studies reviewed with patient

## 2014-08-07 ENCOUNTER — Encounter: Attending: Family Medicine | Primary: Family Medicine

## 2014-12-11 ENCOUNTER — Ambulatory Visit
Admit: 2014-12-11 | Discharge: 2014-12-11 | Payer: PRIVATE HEALTH INSURANCE | Attending: Family Medicine | Primary: Family Medicine

## 2014-12-11 DIAGNOSIS — I10 Essential (primary) hypertension: Secondary | ICD-10-CM

## 2014-12-11 MED ORDER — FERROUS SULFATE 325 MG (65 MG ELEMENTAL IRON) TAB
325 mg (65 mg iron) | ORAL_TABLET | Freq: Every day | ORAL | Status: DC
Start: 2014-12-11 — End: 2015-03-24

## 2014-12-11 MED ORDER — LISINOPRIL-HYDROCHLOROTHIAZIDE 20 MG-12.5 MG TAB
ORAL_TABLET | Freq: Every day | ORAL | Status: DC
Start: 2014-12-11 — End: 2015-03-24

## 2014-12-11 MED ORDER — AMLODIPINE 5 MG TAB
5 mg | ORAL_TABLET | Freq: Every day | ORAL | Status: DC
Start: 2014-12-11 — End: 2015-03-24

## 2014-12-14 NOTE — Progress Notes (Signed)
HISTORY OF PRESENT ILLNESS  Kevin Ellis is a 34 y.o. male.  Hypertension   The history is provided by the patient. This is a chronic problem. Episode onset: Hs had htn for few yrs. Progression since onset: uncontrolled due to non compliance with meds; no meds for 2 mos; worsk a=out of town and had not been seen by any physician. Associated symptoms include palpitations, anxiety, confusion and malaise/fatigue. Pertinent negatives include no nausea and no vomiting. There are no associated agents to hypertension. Risk factors include stress and male gender (hx of drug addiction; had snorted cocaine , was on heroin,  opiates, maryiuana).   Headache  The history is provided by the patient. This is a recurrent problem. Episode frequency: recurrent. The problem has not changed since onset.Pertinent negatives include no abdominal pain. Associated symptoms comments: Easy fatigability, irritabiity. The symptoms are aggravated by stress. Nothing relieves the symptoms. Treatments tried: nsaids. The treatment provided mild relief.   Addiction problem  Primary symptoms include: confusion, somnolence, delusions and intoxication.  There areno hallucinations present at this time.  The history is provided by the patient. This is a chronic problem. Episode onset: had been addicted tp opiates, cocaine, heroin, ,marijuanafoe 10 yrs,; detoxified with suboxane and had been clean ifor 1 6 mos. The problem has been resolved. Suspected agents include cocaine, heroin, opiates and prescription drugs. Associated symptoms include bowel incontinence. Pertinent negatives include no nausea and no vomiting. Associated medical issues include addiction treatment, withdrawal syndrome and mental status change. Associated medical issues do not include suicidal ideas.   Groin Pain  The history is provided by the patient. This is a new problem. Episode onset: had left groin pains for the past 3 mos; hx of lifting,pulling  heavy loads incl furnitures.,reg refrigerators. Episode frequency: recurrent. The problem has not changed (was seen in the Urgent Care and dx with inguinal hernia) since onset.Primary symptoms include swelling.Pertinent negatives include no genital itching, no genital lesions, no genital rash, no penile discharge and no priapism. The symptoms occur at rest and spontaneously. Pertinent negatives include no nausea, no vomiting, no abdominal pain, no abdominal swelling, no constipation and no flank pain. There has been no fever.  He has tried nothing for the symptoms. The treatment provided no relief. Sexual activity: sexually active. He never uses condoms. Patient has had no prior STD.   Partner displays symptoms of an STD: no.   Thyroid Problem  The history is provided by the patient. This is a chronic problem. Episode onset: in 2006 was dx with paplllary thyroid CA; S/P PARTIAL THYROIDECTOMY. Progression since onset: HAD BEEN PLACED ON THYROID PILL SBY-UT NON COMPLIANT WITH MEDS ; HAS HAD NO MESD FOR ABOUT 2 MOS. Pertinent negatives include no abdominal pain. Associated symptoms comments: FIATIGUE,MALAISIE , WEAKNESS . Exacerbated by: NON COMPILANCE WITH HIS MEDS. Nothing relieves the symptoms. He has tried nothing for the symptoms. The treatment provided no relief.   Other  The history is provided by the medical records. This is a chronic problem. Progression since onset: last test 3 yrs ago showed low hgb ; pt had been non compliant with lab work. Pertinent negatives include no abdominal pain. Associated symptoms comments: Fatigue, no energy. Exacerbated by: poor eating habits and non compliant with labs and  o.v. Nothing relieves the symptoms. He has tried nothing for the symptoms. The treatment provided no relief.       Review of Systems   Constitutional: Positive for malaise/fatigue.   Eyes: Negative.  Respiratory: Negative.    Cardiovascular: Positive for palpitations.    Gastrointestinal: Positive for bowel incontinence. Negative for heartburn, nausea, vomiting, abdominal pain and constipation.   Genitourinary: Negative.  Negative for flank pain and penile discharge.   Musculoskeletal:        Groin pains, left   Skin: Negative.    Neurological: Negative.    Endo/Heme/Allergies: Positive for environmental allergies.   Psychiatric/Behavioral: Positive for confusion and substance abuse. Negative for suicidal ideas and hallucinations. The patient is not nervous/anxious.         Resolved, detoxified with suboxane  And underwent counseling; clean for about 16 mos.     Past Medical History   Diagnosis Date   ??? Thyroid cancer ([place]) 2006     Past Surgical History   Procedure Laterality Date   ??? Hx partial thyroidectomy  2006     Family History   Problem Relation Age of Onset   ??? Diabetes Mother    ??? Heart Disease Father        Not on File  Current Outpatient Prescriptions on File Prior to Visit   Medication Sig Dispense Refill   ??? levothyroxine (SYNTHROID) 75 mcg tablet Take 1 Tab by mouth Daily (before breakfast). 30 Tab 2     No current facility-administered medications on file prior to visit.     Patient Active Problem List   Diagnosis Code   ??? Thyroid ca ([place]) C73   ??? Substance abuse F19.10       Filed Vitals:    12/11/14 1437   BP: 162/96   Pulse: 82   Temp: 98.3 ??F (36.8 ??C)   TempSrc: Oral   Resp: 16   Height: 6\' 1"  (1.854 m)   Weight: 184 lb 6.4 oz (83.643 kg)   SpO2: 98%   PainSc:   4   PainLoc: Abdomen     Body mass index is 24.33 kg/(m^2).        Physical Exam   Constitutional: He is oriented to person, place, and time. He appears well-developed and well-nourished. No distress.   HENT:   Right Ear: External ear normal.   Left Ear: External ear normal.   Nose: Nose normal.   Mouth/Throat: No oropharyngeal exudate.   Eyes: Conjunctivae and EOM are normal. Pupils are equal, round, and reactive to light. Right eye exhibits no discharge. Left eye exhibits no  discharge. No scleral icterus.   Neck: Normal range of motion. Neck supple. No JVD present. No tracheal deviation present. No thyromegaly present.   No thyroid nodules, s/p partial thyroidectomy for papillaty CA in 2006   Cardiovascular: Normal rate, regular rhythm, normal heart sounds and intact distal pulses.    Pulmonary/Chest: Effort normal and breath sounds normal.   Abdominal: Soft. Bowel sounds are normal. He exhibits no distension and no mass. There is no tenderness. There is no guarding.   np evidence of inguinal hernia   Genitourinary: Guaiac negative stool. Swelling present in the scrotum and/or testes. No penile tenderness.   Musculoskeletal: He exhibits no edema.   left groin tense and  Tender with spasms  of the groin muscle   Lymphadenopathy:     He has no cervical adenopathy.   Neurological: He is alert and oriented to person, place, and time. He has normal reflexes. He displays normal reflexes. No cranial nerve deficit. He exhibits normal muscle tone. Coordination normal.   Skin: Skin is warm and dry. No erythema.   Psychiatric: He has a normal  mood and affect. His behavior is normal. Judgment and thought content normal.       ASSESSMENT and PLAN  Encounter Diagnoses   Name Primary?   ??? Essential hypertension Yes   ??? Thyroid ca [facility])    ??? Iron deficiency anemia, unspecified iron deficiency anemia type    ??? Other iron deficiency anemia    ??? Screening for lipid disorders    ??? Strain of groin, left, initial encounter    ??? Left lower quadrant pain      Orders Placed This Encounter   ??? NM THYROID SCAN W OR WO FLOW   ??? CT ABD WO CONT   ??? CT PELV WO CONT   ??? CBC WITH AUTOMATED DIFF   ??? LIPID PANEL   ??? METABOLIC PANEL, COMPREHENSIVE   ??? TSH, 3RD GENERATION   ??? FERRITIN   ??? IRON PROFILE   ??? VITAMIN B12   ??? FOLATE   ??? amLODIPine (NORVASC) 5 mg tablet   ??? ferrous sulfate 325 mg (65 mg iron) tablet   ??? lisinopril-hydrochlorothiazide (PRINZIDE, ZESTORETIC) 20-12.5 mg per tablet     Follow-up Disposition:   Return in about 2 weeks (around 12/25/2014).  current treatment plan is effective, no change in therapy  the following changes in treatment are made: restarerd on bp meds with lisinopril hct ; to rx thyroid pills pending lab work  lab results and schedule of future lab studies reviewed with patient  cardiovascular risk and specific lipid/LDL goals reviewed  reviewed medications and side effects in detail

## 2014-12-15 ENCOUNTER — Encounter: Primary: Family Medicine

## 2014-12-16 ENCOUNTER — Ambulatory Visit: Admit: 2014-12-16 | Discharge: 2014-12-16 | Payer: PRIVATE HEALTH INSURANCE | Primary: Family Medicine

## 2014-12-16 ENCOUNTER — Inpatient Hospital Stay: Admit: 2014-12-16 | Payer: MEDICAID | Primary: Family Medicine

## 2014-12-16 DIAGNOSIS — C73 Malignant neoplasm of thyroid gland: Secondary | ICD-10-CM

## 2014-12-16 DIAGNOSIS — D508 Other iron deficiency anemias: Secondary | ICD-10-CM

## 2014-12-16 NOTE — Progress Notes (Signed)
Kathrin Ruddy Labat  ??  Kathrin Ruddy Chhim ?? presents for: Fasting Labs  Fasting:yes  Patient reports following problem: pt tolerated well with no problems or concerns  Follow-up plan:prn  Comments: 1st attempt stick in the right ac 3 green, 1 yellow, and 1 lavender tube collected. ??  Tech: [employer] ??

## 2014-12-17 ENCOUNTER — Encounter

## 2014-12-17 LAB — CBC WITH AUTOMATED DIFF
ABS. BASOPHILS: 0 10*3/uL (ref 0.0–0.1)
ABS. EOSINOPHILS: 0.1 10*3/uL (ref 0.0–0.5)
ABS. LYMPHOCYTES: 2.7 10*3/uL (ref 0.8–3.5)
ABS. MONOCYTES: 0.4 10*3/uL — ABNORMAL LOW (ref 0.8–3.5)
ABS. NEUTROPHILS: 3.1 10*3/uL (ref 1.5–8.0)
BASOPHILS: 0 % (ref 0–2)
EOSINOPHILS: 2 % (ref 0–5)
HCT: 41.7 % (ref 41–53)
HGB: 13.9 g/dL (ref 13.5–17.5)
LYMPHOCYTES: 43 % (ref 19–48)
MCH: 28.8 PG (ref 27–31)
MCHC: 33.3 g/dL (ref 31–37)
MCV: 86.5 FL (ref 78–98)
MONOCYTES: 6 % (ref 3–9)
MPV: 11 FL — ABNORMAL HIGH (ref 5.9–10.3)
NEUTROPHILS: 49 % (ref 40–74)
PLATELET: 200 10*3/uL (ref 130–400)
RBC: 4.82 M/uL (ref 4.7–6.1)
RDW: 12.7 % (ref 11.5–14.5)
WBC: 6.3 10*3/uL (ref 4.5–10.8)

## 2014-12-17 LAB — IRON PROFILE
Iron % saturation: 31 %
Iron: 92 ug/dL (ref 50–175)
TIBC: 299 ug/dL (ref 250–450)

## 2014-12-17 LAB — METABOLIC PANEL, COMPREHENSIVE
A-G Ratio: 1 — ABNORMAL LOW (ref 1.2–2.2)
ALT (SGPT): 33 U/L (ref 12–78)
AST (SGOT): 23 U/L (ref 15–37)
Albumin: 4 g/dL (ref 3.4–5.0)
Alk. phosphatase: 49 U/L (ref 45–117)
Anion gap: 6 mmol/L (ref 6–15)
BUN/Creatinine ratio: 16 (ref 7–25)
BUN: 17 MG/DL (ref 7–18)
Bilirubin, total: 0.3 MG/DL (ref <1.1)
CO2: 31 mmol/L (ref 21–32)
Calcium: 9 MG/DL (ref 8.5–10.1)
Chloride: 102 mmol/L (ref 98–107)
Creatinine: 1.09 MG/DL (ref 0.60–1.30)
GFR est AA: >60 mL/min/{1.73_m2} (ref >60)
GFR est non-AA: >60 mL/min/{1.73_m2} (ref >60)
Globulin: 4 g/dL — ABNORMAL HIGH (ref 2.4–3.5)
Glucose: 86 mg/dL (ref 70–110)
Potassium: 4.3 mmol/L (ref 3.5–5.3)
Protein, total: 8 g/dL (ref 6.4–8.2)
Sodium: 139 mmol/L (ref 136–145)

## 2014-12-17 LAB — LIPID PANEL
Cholesterol, total: 172 MG/DL (ref <200)
HDL Cholesterol: 48 MG/DL (ref 32–96)
LDL, calculated: 105.92 MG/DL (ref <130)
Triglyceride: 113 MG/DL (ref <150)
VLDL, calculated: 22.6 MG/DL (ref 5–32)

## 2014-12-17 LAB — VITAMIN B12: Vitamin B12: 342 pg/mL (ref 254–1320)

## 2014-12-17 LAB — FERRITIN: Ferritin: 142 NG/ML (ref 26–388)

## 2014-12-17 LAB — TSH 3RD GENERATION: TSH: 2.1 u[IU]/mL (ref 0.35–3.74)

## 2014-12-17 LAB — FOLATE: Folate: 13.5 ng/mL (ref 3.1–17.5)

## 2014-12-17 MED ORDER — LEVOTHYROXINE 75 MCG TAB
75 mcg | ORAL_TABLET | Freq: Every day | ORAL | Status: DC
Start: 2014-12-17 — End: 2015-03-24

## 2014-12-17 NOTE — Progress Notes (Signed)
Quick Note:        Normal result, please notify patient    ______

## 2014-12-17 NOTE — Progress Notes (Signed)
Quick Note:        Attempted to call patient left message. Will try again.    ______

## 2014-12-18 NOTE — Progress Notes (Signed)
Quick Note:        Called patient with normal results. Patient verbalized understanding has no further questions at this time.    ______

## 2014-12-26 ENCOUNTER — Ambulatory Visit: Payer: MEDICAID | Primary: Family Medicine

## 2014-12-30 ENCOUNTER — Encounter

## 2014-12-30 NOTE — Progress Notes (Signed)
Request for order change for correct order per [facility] in scheduling. Dr. Garrel Ridgel made aware of corrected orders.

## 2014-12-31 ENCOUNTER — Ambulatory Visit: Payer: MEDICAID | Primary: Family Medicine

## 2015-01-07 ENCOUNTER — Ambulatory Visit: Payer: MEDICAID | Primary: Family Medicine

## 2015-01-26 ENCOUNTER — Inpatient Hospital Stay: Admit: 2015-01-26 | Payer: MEDICAID | Attending: Family Medicine | Primary: Family Medicine

## 2015-01-26 DIAGNOSIS — R1032 Left lower quadrant pain: Secondary | ICD-10-CM

## 2015-01-26 MED ORDER — BARIUM SULFATE 2 % ORAL SUSP
2.1 % (w/v), 2.0 % (w/w) | Freq: Once | ORAL | Status: AC
Start: 2015-01-26 — End: 2015-01-26
  Administered 2015-01-26: 18:00:00 via ORAL

## 2015-01-26 MED FILL — READI-CAT 2  2.1 % (W/V), 2.0 % (W/W) ORAL SUSPENSION: 2.1 % (w/v), 2.0 % (w/w) | ORAL | Qty: 125

## 2015-01-26 NOTE — Progress Notes (Signed)
Quick Note:        Normal result, please notify patient    ______

## 2015-01-27 ENCOUNTER — Inpatient Hospital Stay: Admit: 2015-01-27 | Payer: MEDICAID | Attending: Family Medicine | Primary: Family Medicine

## 2015-01-27 DIAGNOSIS — C73 Malignant neoplasm of thyroid gland: Secondary | ICD-10-CM

## 2015-01-27 MED ORDER — SODIUM IODIDE I-123 3.7 (100) MBQ (MCI) PO CAPS
Freq: Once | Status: AC
Start: 2015-01-27 — End: 2015-01-27
  Administered 2015-01-27: 16:00:00 via ORAL

## 2015-01-27 MED FILL — SODIUM IODIDE I-123 3.7 (100) MBQ (MCI) PO CAPS: Qty: 1000

## 2015-01-27 NOTE — Progress Notes (Signed)
Quick Note:        Called patient with normal results. Patient verbalized understanding has no further questions at this time.    ______

## 2015-01-27 NOTE — Progress Notes (Signed)
THYROID SCAN & UPTAKE WAS COMPLETED 75:10 PM WITH NO COMPLICATIONS BY CARLA J GESWEIN.    PATIENT WAS GIVEN [id number].

## 2015-01-28 ENCOUNTER — Inpatient Hospital Stay: Admit: 2015-01-28 | Payer: MEDICAID | Attending: Family Medicine | Primary: Family Medicine

## 2015-01-28 NOTE — Progress Notes (Signed)
Quick Note:        Normal result, please notify patient    ______

## 2015-01-30 NOTE — Progress Notes (Signed)
Quick Note:        Called patient with normal results. Patient verbalized understanding has no further questions at this time.    ______

## 2015-03-24 ENCOUNTER — Ambulatory Visit
Admit: 2015-03-24 | Discharge: 2015-03-24 | Payer: PRIVATE HEALTH INSURANCE | Attending: Family Medicine | Primary: Family Medicine

## 2015-03-24 ENCOUNTER — Inpatient Hospital Stay: Admit: 2015-03-24 | Payer: MEDICAID | Primary: Family Medicine

## 2015-03-24 DIAGNOSIS — R399 Unspecified symptoms and signs involving the genitourinary system: Secondary | ICD-10-CM

## 2015-03-24 DIAGNOSIS — I10 Essential (primary) hypertension: Secondary | ICD-10-CM

## 2015-03-24 LAB — URINALYSIS W/ RFLX MICROSCOPIC
Bilirubin: NEGATIVE
Blood: NEGATIVE
Glucose: NEGATIVE mg/dL
Ketone: NEGATIVE mg/dL
Leukocyte Esterase: NEGATIVE
Nitrites: NEGATIVE
Protein: NEGATIVE mg/dL
Specific gravity: 1.02 (ref 1.002–1.030)
Urobilinogen: 0.2 EU/dL (ref 0–1)
pH (UA): 6.5 (ref 4.5–8.0)

## 2015-03-24 MED ORDER — AMLODIPINE 5 MG TAB
5 mg | ORAL_TABLET | Freq: Every day | ORAL | Status: AC
Start: 2015-03-24 — End: ?

## 2015-03-24 MED ORDER — CLINDAMYCIN 300 MG CAP
300 mg | ORAL_CAPSULE | Freq: Three times a day (TID) | ORAL | Status: AC
Start: 2015-03-24 — End: ?

## 2015-03-24 MED ORDER — FERROUS SULFATE 325 MG (65 MG ELEMENTAL IRON) TAB
325 mg (65 mg iron) | ORAL_TABLET | Freq: Every day | ORAL | Status: AC
Start: 2015-03-24 — End: ?

## 2015-03-24 MED ORDER — LISINOPRIL-HYDROCHLOROTHIAZIDE 20 MG-12.5 MG TAB
ORAL_TABLET | Freq: Every day | ORAL | Status: AC
Start: 2015-03-24 — End: ?

## 2015-03-24 MED ORDER — LEVOTHYROXINE 75 MCG TAB
75 mcg | ORAL_TABLET | Freq: Every day | ORAL | Status: AC
Start: 2015-03-24 — End: ?

## 2015-03-24 NOTE — Progress Notes (Signed)
HISTORY OF PRESENT ILLNESS  Kevin Ellis is a 34 y.o. male.  Jaw Swelling  The history is provided by the patient. This is a new problem. Episode onset: had been having jaw swelling and pains  from infected lower jaw molar and giingiva for few  days. The problem occurs constantly. The problem has been gradually worsening. Associated symptoms include abdominal pain. Associated symptoms comments: Pains and difficulty of eating . Nothing aggravates the symptoms. Nothing relieves the symptoms. He has tried acetaminophen for the symptoms. The treatment provided no (scheduled to see an oral surgeon in 4 mos.) relief.   Hypertension   The history is provided by the patient. This is a chronic problem. Episode onset: has had htn for several yrs. 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 Associated symptoms include palpitations and anxiety. Pertinent negatives include no nausea and no vomiting. There are no associated agents to hypertension. Risk factors include dyslipidemia, stress, male gender and non-compliant.   Thyroid Problem  The history is provided by the patient. This is a chronic problem. Episode onset: hx of papillary thyroid ca s/ppartial thyroidectomy in  2006. Progression since onset: no recurrence recent thyroid scan showed normal functioning residual thyroid gland wih no  abnormalities noted. Associated symptoms include abdominal pain. Associated symptoms comments: Aymptomatic; hypothyroid sx relieved with thyroid meds. Nothing aggravates the symptoms. The symptoms are relieved by medications. Treatments tried: thyroid meds. The treatment provided significant relief.   Abdominal Pain   The history is provided by the patient. This is a recurrent problem. Episode onset: has had recurrent left lower abdominal pains for  about   4-5 mos. The problem has not changed (abdominal pains radiate to the left gfroin and left testicle) since onset.Associated with: nothing. The pain is at a severity of 4/10. The pain is moderate. Associated symptoms include frequency and testicular pain. Pertinent negatives include no diarrhea, no nausea, no vomiting, no constipation, no dysuria and no hematuria. Nothing worsens the pain. The pain is relieved by nothing. Past workup includes CT scan. Past workup comments: showed no stones, hernias or any abniormal changes. His past medical history is significant for cancer. His past medical history does not include UTI or kidney stones. Past medical history comments: paplllary ca of thyriiod. The patient's surgical history non-contributory.  Urinary Frequency   The history is provided by the patient. This is a recurrent problem. Episode onset: has had frequency , occ hesitancy, feeling  of incomplete emptying and occ poor urinary flow for several wks. The problem has not changed since onset.The patient is experiencing no pain. There has been no fever. He is sexually active. There is no history of pyelonephritis. Associated symptoms include frequency, hesitancy, urgency and abdominal pain. Pertinent negatives include no nausea, no vomiting, no hematuria, no flank pain and no penile discharge. The patient is not pregnant.He has tried nothing for the symptoms. The treatment provided no relief. His past medical history does not include kidney stones, urological procedure, recurrent UTIs, urinary stasis or catheterization. Past medical history comments: paplllary ca of thyriiod.       Review of Systems   Constitutional: Negative.    HENT: Negative.    Eyes: Negative.    Respiratory: Negative.    Cardiovascular: Positive for palpitations.   Gastrointestinal: Positive for abdominal pain. Negative for nausea, vomiting, diarrhea, constipation and blood in stool.  Left lower abdominal pains with radiation to the left testicle   Genitourinary: Positive for hesitancy, urgency, frequency and testicular pain. Negative for dysuria, hematuria, flank pain and penile discharge.   Musculoskeletal: Negative.    Skin: Negative.    Neurological: Negative.    Psychiatric/Behavioral: Positive for substance abuse. Negative for depression and suicidal ideas. The patient is nervous/anxious.         Hx of substance abuse snorting cocaine, opiates, marijuana, heroin ; detoxified with suboxone and clear for about 16 mos      Past Medical History   Diagnosis Date   ??? Thyroid cancer ([place]) 2006     Past Surgical History   Procedure Laterality Date   ??? Hx partial thyroidectomy  2006     Family History   Problem Relation Age of Onset   ??? Diabetes Mother    ??? Heart Disease Father        Not on File  No current outpatient prescriptions on file prior to visit.     No current facility-administered medications on file prior to visit.     Patient Active Problem List   Diagnosis Code   ??? Thyroid ca ([place]) C73   ??? Substance abuse F19.10       Filed Vitals:    03/24/15 1507   BP: 128/88   Pulse: 68   Temp: 98 ??F (36.7 ??C)   TempSrc: Oral   Resp: 16   Height: 6\' 1"  (1.854 m)   Weight: 179 lb (81.194 kg)   SpO2: 99%   PainSc:   0 - No pain     Body mass index is 23.62 kg/(m^2).        Physical Exam   Constitutional: He is oriented to person, place, and time. He appears well-developed and well-nourished. No distress.   HENT:   Right Ear: External ear normal.   Left Ear: External ear normal.   Nose: Nose normal.   Mouth/Throat: No oropharyngeal exudate.   Eyes: Conjunctivae and EOM are normal. Pupils are equal, round, and reactive to light. Right eye exhibits no discharge. Left eye exhibits no discharge. No scleral icterus.   Neck: Normal range of motion. Neck supple. No JVD present. No tracheal deviation present. No thyromegaly present.   Cardiovascular: Normal rate, regular rhythm, normal heart sounds and  intact distal pulses.  Exam reveals no gallop.    No murmur heard.  Pulmonary/Chest: Effort normal and breath sounds normal. No respiratory distress. He has no wheezes. He has no rales.   Abdominal: Soft. Bowel sounds are normal. He exhibits no distension and no mass. There is no tenderness. There is no guarding.   Musculoskeletal: Normal range of motion. He exhibits no edema or tenderness.   Lymphadenopathy:     He has no cervical adenopathy.   Neurological: He is alert and oriented to person, place, and time. He has normal reflexes. He displays normal reflexes. No cranial nerve deficit. He exhibits abnormal muscle tone. Coordination normal.   Skin: Skin is warm. No rash noted. No erythema.   Psychiatric: His behavior is normal. Judgment and thought content normal.   Anxious with flat affect       ASSESSMENT and PLAN  Encounter Diagnoses   Name Primary?   ??? Essential hypertension with goal blood pressure less than 130/80 Yes   ??? Postoperative hypothyroidism    ??? Iron deficiency anemia, unspecified iron deficiency anemia type    ??? Lower urinary tract symptoms (LUTS)    ???  Hx of thyroid cancer    ??? Gingivitis    ??? History of substance abuse      Orders Placed This Encounter   ??? CULTURE, URINE   ??? URINALYSIS W/ RFLX MICROSCOPIC   ??? levothyroxine (SYNTHROID) 75 mcg tablet   ??? amLODIPine (NORVASC) 5 mg tablet   ??? ferrous sulfate 325 mg (65 mg iron) tablet   ??? lisinopril-hydrochlorothiazide (PRINZIDE, ZESTORETIC) 20-12.5 mg per tablet   ??? clindamycin (CLEOCIN) 300 mg capsule     Follow-up Disposition:  Return in about 4 weeks (around 04/21/2015), or if symptoms worsen or fail to improve.  current treatment plan is effective, no change in therapyN THAT  the following changes in treatment are made: STARTED ON ANTIBIOTICS GIVEN THAT HE  HAS POSS ABSCESS TOOTH AND INFECTED GINGIVA AND WON'T BE ABLE TO SEE AN ORAL SURGEON FOR 4 MOS  lab results and schedule of future lab studies reviewed with patient.   cardiovascular risk and specific lipid/LDL goals reviewed

## 2015-03-27 LAB — CULTURE, URINE
Culture result:: 13000
Culture: 13000

## 2015-03-28 NOTE — Progress Notes (Signed)
Quick Note:        Abnormal results. Please poss contamination; to repeat culture        ______

## 2015-03-31 NOTE — Progress Notes (Signed)
Quick Note:        Attempted to call patient. Left a message and will try again    ______

## 2015-04-01 NOTE — Progress Notes (Signed)
Quick Note:        Left message with patients PHI approved individual, she states that patient will not be back in town for a few months but will repeat test when he is available.    ______

## 2016-06-14 ENCOUNTER — Encounter: Payer: Self-pay | Admitting: Emergency Medicine

## 2016-06-14 ENCOUNTER — Emergency Department: Payer: Medicaid Other

## 2016-06-14 ENCOUNTER — Emergency Department
Admission: EM | Admit: 2016-06-14 | Discharge: 2016-06-14 | Disposition: A | Discharge status: Home / Self Care | Payer: Medicaid Other | Attending: Emergency Medicine | Admitting: Emergency Medicine

## 2016-06-14 DIAGNOSIS — S21111A Laceration without foreign body of right front wall of thorax without penetration into thoracic cavity, initial encounter: Secondary | ICD-10-CM | POA: Insufficient documentation

## 2016-06-14 DIAGNOSIS — Z85850 Personal history of malignant neoplasm of thyroid: Secondary | ICD-10-CM | POA: Insufficient documentation

## 2016-06-14 DIAGNOSIS — W12XXXA Fall on and from scaffolding, initial encounter: Secondary | ICD-10-CM | POA: Diagnosis not present

## 2016-06-14 DIAGNOSIS — Y929 Unspecified place or not applicable: Secondary | ICD-10-CM | POA: Insufficient documentation

## 2016-06-14 DIAGNOSIS — S2191XA Laceration without foreign body of unspecified part of thorax, initial encounter: Secondary | ICD-10-CM

## 2016-06-14 DIAGNOSIS — S299XXA Unspecified injury of thorax, initial encounter: Secondary | ICD-10-CM | POA: Diagnosis present

## 2016-06-14 DIAGNOSIS — Y939 Activity, unspecified: Secondary | ICD-10-CM | POA: Insufficient documentation

## 2016-06-14 DIAGNOSIS — Y990 Civilian activity done for income or pay: Secondary | ICD-10-CM | POA: Diagnosis not present

## 2016-06-14 HISTORY — DX: Malignant neoplasm of thyroid gland: C73

## 2016-06-14 LAB — COMPREHENSIVE METABOLIC PANEL
ALT: 16 U/L — ABNORMAL LOW (ref 17–63)
ANION GAP: 10 (ref 5–15)
AST: 25 U/L (ref 15–41)
Albumin: 4.6 g/dL (ref 3.5–5.0)
Alkaline Phosphatase: 46 U/L (ref 38–126)
BUN: 17 mg/dL (ref 6–20)
CHLORIDE: 105 mmol/L (ref 101–111)
CO2: 24 mmol/L (ref 22–32)
Calcium: 9.7 mg/dL (ref 8.9–10.3)
Creatinine, Ser: 1.06 mg/dL (ref 0.61–1.24)
Glucose, Bld: 113 mg/dL — ABNORMAL HIGH (ref 65–99)
POTASSIUM: 3.8 mmol/L (ref 3.5–5.1)
Sodium: 139 mmol/L (ref 135–145)
Total Bilirubin: 0.7 mg/dL (ref 0.3–1.2)
Total Protein: 8.2 g/dL — ABNORMAL HIGH (ref 6.5–8.1)

## 2016-06-14 LAB — CBC WITH DIFFERENTIAL/PLATELET
BASOS ABS: 0 10*3/uL (ref 0–0.1)
Basophils Relative: 0 %
EOS PCT: 0 %
Eosinophils Absolute: 0 10*3/uL (ref 0–0.7)
HCT: 41.3 % (ref 40.0–52.0)
Hemoglobin: 14.2 g/dL (ref 13.0–18.0)
LYMPHS PCT: 28 %
Lymphs Abs: 2.8 10*3/uL (ref 1.0–3.6)
MCH: 28.9 pg (ref 26.0–34.0)
MCHC: 34.3 g/dL (ref 32.0–36.0)
MCV: 84.4 fL (ref 80.0–100.0)
MONO ABS: 0.4 10*3/uL (ref 0.2–1.0)
Monocytes Relative: 4 %
Neutro Abs: 6.8 10*3/uL — ABNORMAL HIGH (ref 1.4–6.5)
Neutrophils Relative %: 68 %
PLATELETS: 229 10*3/uL (ref 150–440)
RBC: 4.9 MIL/uL (ref 4.40–5.90)
RDW: 13 % (ref 11.5–14.5)
WBC: 10.1 10*3/uL (ref 3.8–10.6)

## 2016-06-14 MED ORDER — IOPAMIDOL (ISOVUE-300) INJECTION 61%
75.0000 mL | Freq: Once | INTRAVENOUS | Status: AC | PRN
  Administered 2016-06-14: 75 mL via INTRAVENOUS

## 2016-06-14 MED ORDER — HYDROMORPHONE HCL 1 MG/ML IJ SOLN
1.0000 mg | Freq: Once | INTRAMUSCULAR | Status: AC
  Administered 2016-06-14: 1 mg via INTRAVENOUS
  Filled 2016-06-14: qty 1

## 2016-06-14 MED ORDER — CEFAZOLIN IN D5W 1 GM/50ML IV SOLN
1.0000 g | Freq: Once | INTRAVENOUS | Status: AC
  Administered 2016-06-14: 1 g via INTRAVENOUS
  Filled 2016-06-14: qty 50

## 2016-06-14 MED ORDER — LIDOCAINE-EPINEPHRINE 2 %-1:100000 IJ SOLN
30.0000 mL | Freq: Once | INTRAMUSCULAR | Status: AC
  Administered 2016-06-14: 30 mL via INTRADERMAL
  Filled 2016-06-14: qty 30

## 2016-06-14 MED ORDER — LIDOCAINE HCL (PF) 1 % IJ SOLN
30.0000 mL | Freq: Once | INTRAMUSCULAR | Status: DC
  Filled 2016-06-14: qty 30

## 2016-06-14 MED ORDER — TRAMADOL HCL 50 MG PO TABS: 50.0000 mg | ORAL_TABLET | Freq: Four times a day (QID) | ORAL | 0 refills | Status: AC | PRN

## 2016-06-14 MED ORDER — CEPHALEXIN 500 MG PO CAPS: 500.0000 mg | ORAL_CAPSULE | Freq: Four times a day (QID) | ORAL | 0 refills | Status: AC

## 2016-06-14 MED ORDER — LIDOCAINE-EPINEPHRINE 1 %-1:100000 IJ SOLN: 30.0000 mL | Freq: Once | INTRAMUSCULAR | Status: DC

## 2016-06-14 MED ORDER — LIDOCAINE-EPINEPHRINE (PF) 1 %-1:200000 IJ SOLN
INTRAMUSCULAR | Status: AC
  Administered 2016-06-14: 18:00:00
  Filled 2016-06-14: qty 60

## 2016-06-14 MED ORDER — ONDANSETRON HCL 4 MG/2ML IJ SOLN
4.0000 mg | Freq: Once | INTRAMUSCULAR | Status: AC
  Administered 2016-06-14: 4 mg via INTRAVENOUS
  Filled 2016-06-14: qty 2

## 2016-06-14 MED ORDER — KETOROLAC TROMETHAMINE 30 MG/ML IJ SOLN
15.0000 mg | Freq: Once | INTRAMUSCULAR | Status: AC
  Administered 2016-06-14: 15 mg via INTRAVENOUS
  Filled 2016-06-14: qty 1

## 2016-06-14 NOTE — ED Notes (Signed)
Upon answering patients call light wound is bleeding through saline gauze. MD notified, Dr. Marcelene Butte states to hold pressure and to pack ABD pad with saline gauzes. ABD pad applied and taped. Patient denies SOB or CP. VSS. CT called and notified of need of CT chest stat.

## 2016-06-14 NOTE — ED Notes (Signed)
Pharmacy called and notified of need of lidocaine. States they will send it.

## 2016-06-14 NOTE — ED Notes (Signed)
Wet saline gauze taped over wound.

## 2016-06-14 NOTE — ED Notes (Signed)
Patient ambulated to restroom without difficulty.

## 2016-06-14 NOTE — ED Triage Notes (Addendum)
Patient arrived via POV to ED after falling off of a scaffold. Patient fell 5-6 ft and punctured right side of chest below nipple on a metal piece of a propane tank. Open wound about 4 inches long. Bleeding controlled at this time. Patient is A&O x4. Speaking in full sentences. Patient rates his pain a 7/10.

## 2016-06-14 NOTE — ED Notes (Signed)
Steri strips applied to laceration. Patient tolerated well. Denies pain.

## 2016-06-14 NOTE — ED Notes (Signed)
MD at bedside. Wound sutured by MD assisted by this RN. Patient tolerated well. Denies pain.

## 2016-06-14 NOTE — Discharge Instructions (Signed)
Continue wound twice a day with hydrogen peroxide and applied Neosporin. Keep the wound dry for the first 24 hours. Suture removal in 10 days. 11 skin sutures

## 2016-06-14 NOTE — ED Provider Notes (Signed)
Time Seen: Approximately 1507  I have reviewed the triage notes  Chief Complaint: Chest Injury   History of Present Illness: Alex Ellis is a 35 y.o. male who had a work-related accident. Patient apparently fell off of a scaffold and fell approximately 5-6 feet and lacerated the right side of his chest on a metal piece of a propane tank. Wound is actually approximately 10 cm in size. He denies any shortness of breath. He denies any other injury from the fall. Past Medical History:  Diagnosis Date  . Thyroid cancer ([place])     There are no active problems to display for this patient.   Past Surgical History:  Procedure Laterality Date  . THYROID SURGERY      Past Surgical History:  Procedure Laterality Date  . THYROID SURGERY        Allergies:  Review of patient's allergies indicates no known allergies.  Family History: No family history on file.  Social History: Social History  Substance Use Topics  . Smoking status: Never Smoker  . Smokeless tobacco: Never Used  . Alcohol use No     Review of Systems:   10 point review of systems was performed and was otherwise negative:  Constitutional: No fever Eyes: No visual disturbances ENT: No sore throat, ear pain Cardiac: Pain is exclusively around the laceration site Respiratory: No shortness of breath, wheezing, or stridor Abdomen: No abdominal pain, no vomiting, No diarrhea Endocrine: No weight loss, No night sweats Extremities: No peripheral edema, cyanosis Skin: No rashes, easy bruising Neurologic: No focal weakness, trouble with speech or swollowing Urologic: No dysuria, Hematuria, or urinary frequency   Physical Exam:  ED Triage Vitals  Enc Vitals Group     BP 06/14/16 1503 140/88     Pulse Rate 06/14/16 1503 72     Resp 06/14/16 1503 17     Temp 06/14/16 1503 98.2 F (36.8 C)     Temp Source 06/14/16 1503 Oral     SpO2 06/14/16 1503 99 %     Weight 06/14/16 1504 190 lb (86.2 kg)     Height  06/14/16 1504 6' (1.829 m)     Head Circumference --      Peak Flow --      Pain Score 06/14/16 1504 7     Pain Loc --      Pain Edu? --      Excl. in [place]? --     General: Awake , Alert , and Oriented times 3; GCS 15 Head: Normal cephalic , atraumatic Eyes: Pupils equal , round, reactive to light Nose/Throat: No nasal drainage, patent upper airway without erythema or exudate.  Neck: Supple, Full range of motion, No anterior adenopathy or palpable thyroid masses Lungs: Clear to ascultation without wheezes , rhonchi, or rales Heart: Regular rate, regular rhythm without murmurs , gallops , or rubs Abdomen: Soft, non tender without rebound, guarding , or rigidity; bowel sounds positive and symmetric in all 4 quadrants. No organomegaly .        Extremities: 2 plus symmetric pulses. No edema, clubbing or cyanosis Neurologic: normal ambulation, Motor symmetric without deficits, sensory intact Skin: warm, dry, no rashes Chest wall shows an 10 cm jagged laceration with visible subcutaneous tissue and visible muscle tissue without any crepitus or step-off noted. Muscle tissue only visible on the lateral 1 cm of the wound. Small amount of active bleeding. Patient also has 2 semicircular skin only with lacerations inferiorly approximately 4 cm total in  size Labs:   All laboratory work was reviewed including any pertinent negatives or positives listed below:  Labs Reviewed  CBC WITH DIFFERENTIAL/PLATELET - Abnormal; Notable for the following:       Result Value   Neutro Abs 6.8 (*)    All other components within normal limits  COMPREHENSIVE METABOLIC PANEL - Abnormal; Notable for the following:    Glucose, Bld 113 (*)    Total Protein 8.2 (*)    ALT 16 (*)    All other components within normal limits    EKG:  ED ECG REPORT I, Daymon Larsen, the attending physician, personally viewed and interpreted this ECG.  Date: 06/14/2016 EKG Time: 1501 Rate: 82 Rhythm: normal sinus rhythm QRS  Axis: normal Intervals: normal ST/T Wave abnormalities: normal Conduction Disturbances: none Narrative Interpretation: unremarkable Possible left atrial enlargement Normal EKG  Radiology:  "Ct Chest W Contrast  Addendum Date: 06/14/2016   ADDENDUM REPORT: 06/14/2016 17:36 ADDENDUM: 7 mm left lower lobe lung nodule. Non-contrast chest CT at 6-12 months is recommended. If the nodule is stable at time of repeat CT, then future CT at 18-24 months (from today's scan) is considered optional for low-risk patients, but is recommended for high-risk patients. This recommendation follows the consensus statement: Guidelines for Management of Incidental Pulmonary Nodules Detected on CT Images:From the [employer] 2017; published online before print (10.1148/radiol.[id number]). Electronically Signed   By: Logan Bores M.D.   On: 06/14/2016 17:36   Result Date: 06/14/2016 CLINICAL DATA:  Trauma. Fell 5-6 feet off of a scaffold and punctured the right side of the chest below the nipple on a metal piece of a propane tank. 4 inch long open wound. Initial encounter. EXAM: CT CHEST WITH CONTRAST TECHNIQUE: Multidetector CT imaging of the chest was performed during intravenous contrast administration. CONTRAST:  48mL ISOVUE-300 IOPAMIDOL (ISOVUE-300) INJECTION 61% COMPARISON:  None. FINDINGS: Cardiovascular: The heart is normal in size. The thoracic aorta is normal in caliber. There is no evidence of great vessel injury. Mediastinum/Nodes: No enlarged mediastinal or hilar lymph nodes. Surgical clips in the lower neck related to prior partial thyroidectomy. Lungs/Pleura: No pleural effusion or pneumothorax. Minimal scarring in the lung apices. 9 x 5 mm (mean 7 mm) nodule in the medial left lower lobe (series 3, image 91). Major airways are patent. Upper Abdomen: Unremarkable. Musculoskeletal: Soft tissue injury to the right anterolateral chest wall with subcutaneous stranding/contusion and moderate volume  subcutaneous emphysema. No radiopaque foreign body identified. No fracture is identified. IMPRESSION: 1. Soft tissue injury to the right chest wall with subcutaneous emphysema and contusion. 2. No fracture or evidence of acute lung or vascular injury. Electronically Signed: By: Logan Bores M.D. On: 06/14/2016 16:41  "   I personally reviewed the radiologic studies   Procedures:  LACERATION REPAIR Performed by: Daymon Larsen Authorized by: Daymon Larsen Consent: Verbal consent obtained. Risks and benefits: risks, benefits and alternatives were discussed Consent given by: patient Patient identity confirmed: provided demographic data Prepped and Draped in normal sterile fashion Wound explored  Laceration Location: Right chest below the right nipple  Laceration Length: 10 cm*c No Foreign Bodies seen or palpated  Anesthesia: local infiltration  Local anesthetic: lidocaine *2% % with epinephrine  Anesthetic total: 12  Irrigation method: syringe Amount of clewound had debridement of dead tissue along the edges and copious irrigation    Closure 4 times 4. 0 Vicryl subcutaneous sutures for skin realignment.  11 times 4. 0 Prolene sutures  Number of sutures:  total of 15 Steri-Strip closure Technique:layered Patient tolerance: Patient tolerated the procedure well with no immediate complications.   ED Course:  The patient received Toradol IV, Dilaudid 1 mg IV and Zofran 4 mg IV the patient received IV due to the depth of the wound. He had debridement of dead tissue and the wound was copiously irrigated. 2 layered realignment.    Clinical Course     Assessment: * Acute chest wall wound 11 cm 2 layer closure chest wound 2 superficial lacerations closed by Steri-Strips by the nursing staff     Plan:  Outpatient prescription for Keflex and Ultram Patient was advised to return immediately if condition worsens. Patient was advised to follow up with their primary care  physician or other specialized physicians involved in their outpatient care. The patient and/or family member/power of attorney had laboratory results reviewed at the bedside. All questions and concerns were addressed and appropriate discharge instructions were distributed by the nursing staff.             Daymon Larsen, MD 06/14/16 [id number]

## 2016-06-14 NOTE — ED Notes (Signed)
Patient transported to CT 

## 2018-03-22 IMAGING — CT CT CHEST W/ CM
2 of 3 series · 15 of 36 positions shown, 18 images · IV contrast (iopamidol)
Comparison: None.

ADDENDUM:
7 mm left lower lobe lung nodule. Non-contrast chest CT at 6-12
months is recommended. If the nodule is stable at time of repeat CT,
then future CT at 18-24 months (from today's scan) is considered
optional for low-risk patients, but is recommended for high-risk
patients. This recommendation follows the consensus statement:
Guidelines for Management of Incidental Pulmonary Nodules Detected
on CT Images:From the [HOSPITAL] 9998; published online
before print (10.1148/[id number]).
CLINICAL DATA: Trauma. Fell [DATE] feet off of a scaffold and
punctured the right side of the chest below the nipple on a metal
piece of a propane tank. 4 inch long open wound. Initial encounter.

EXAM:
CT CHEST WITH CONTRAST
TECHNIQUE: Multidetector CT imaging of the chest was performed during
intravenous contrast administration.
CONTRAST:  75mL S44CSM-4ZZ IOPAMIDOL (S44CSM-4ZZ) INJECTION 61%

[Series 2: axial st · axial · 0.70mm/px · z∈[-846,-566]mm · 12 of 166 slices shown, 15 images]
[im 13/166  mediastinal]
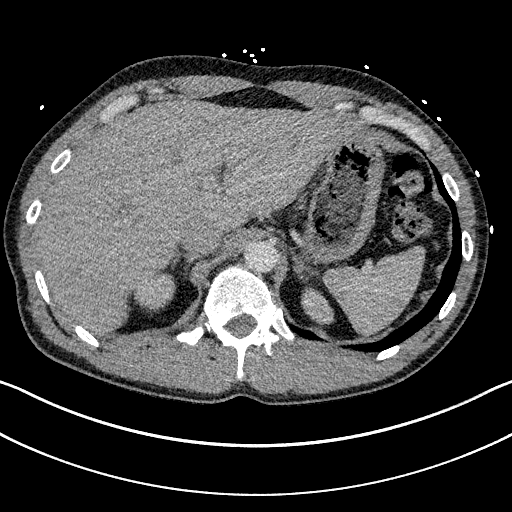
[im 13/166  lung]
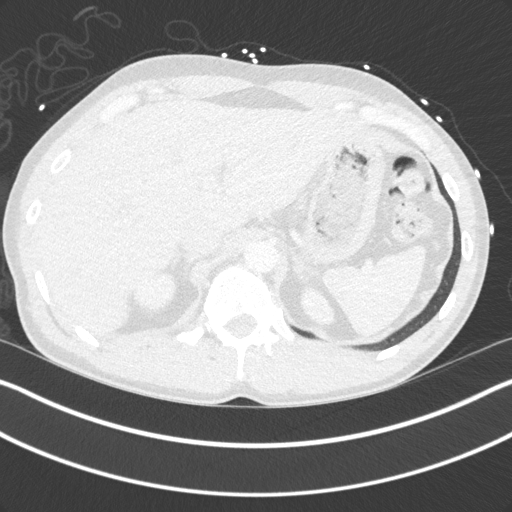
[im 25/166  lung]
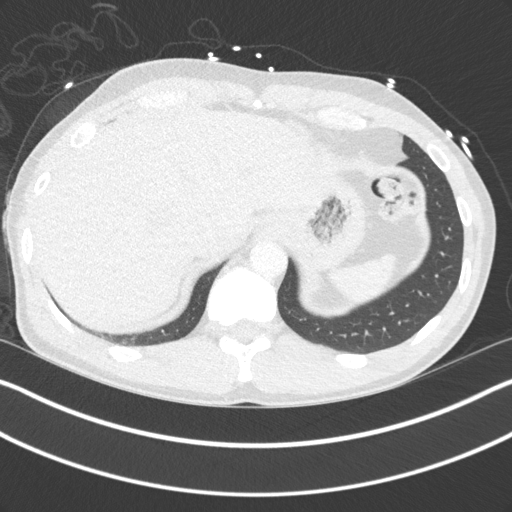
[im 37/166  lung]
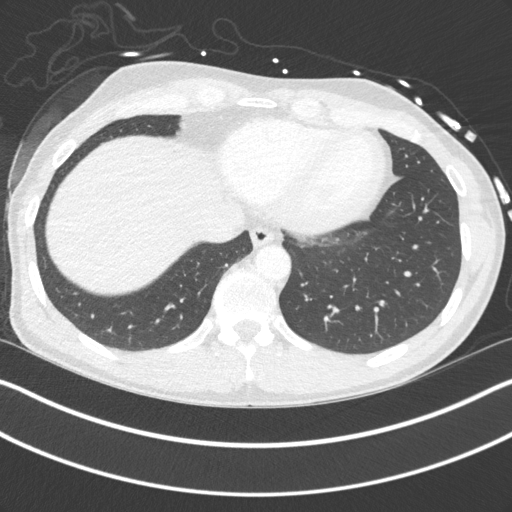
[im 49/166  lung]
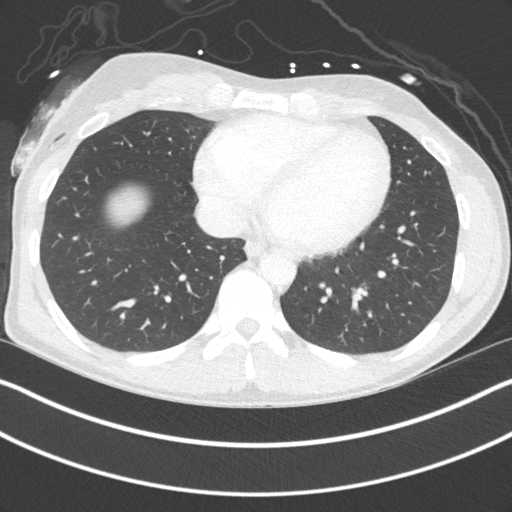
[im 62/166  mediastinal]
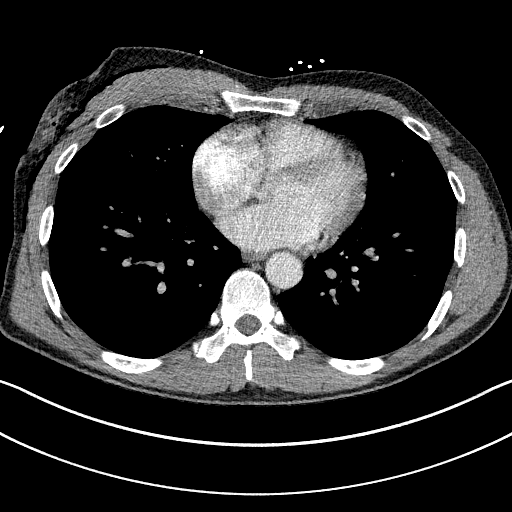
[im 62/166  lung]
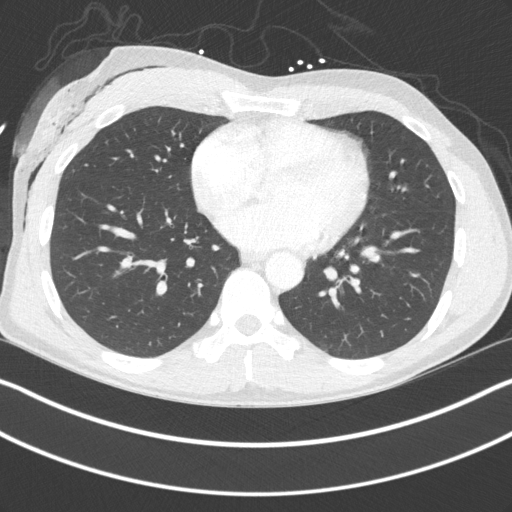
[im 74/166  lung]
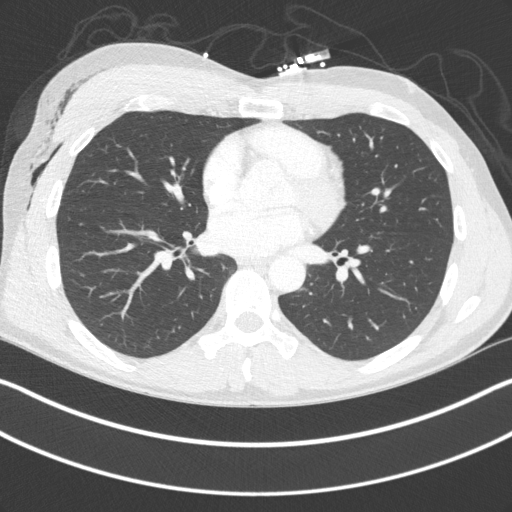
[im 92/166  lung]
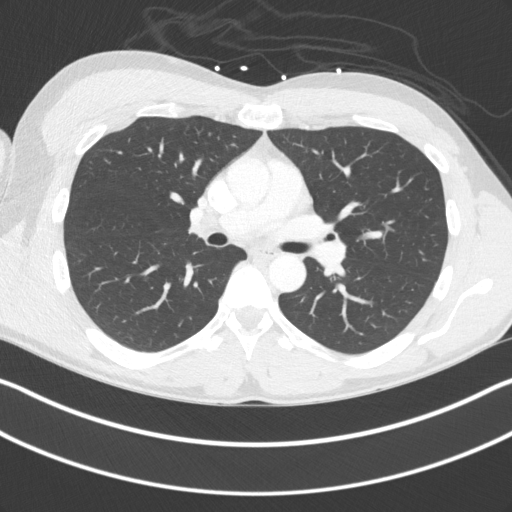
[im 104/166  lung]
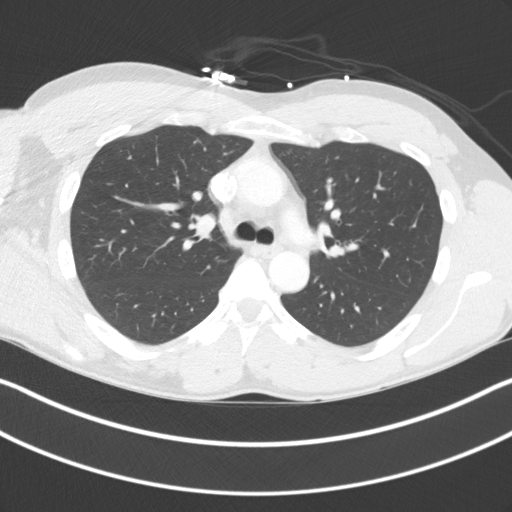
[im 117/166  mediastinal]
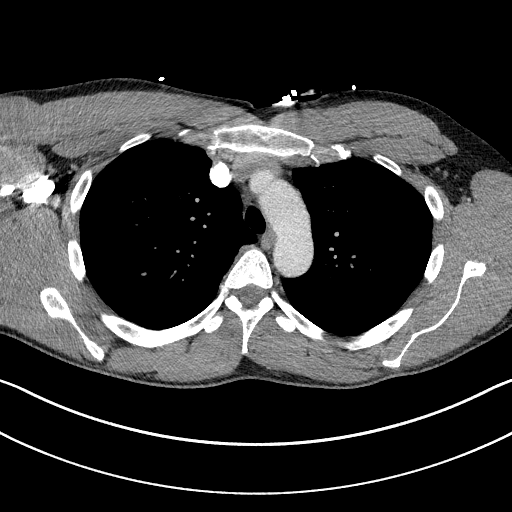
[im 117/166  lung]
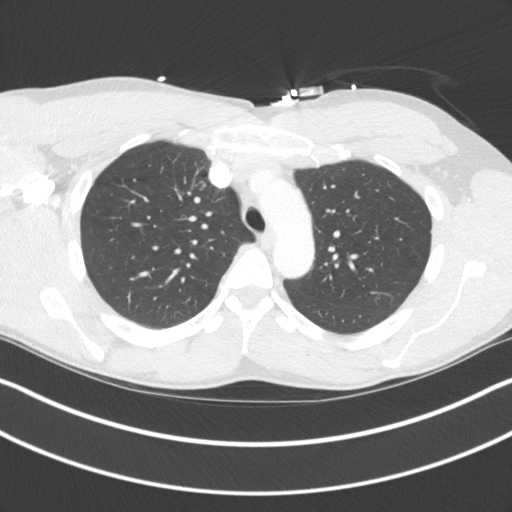
[im 129/166  lung]
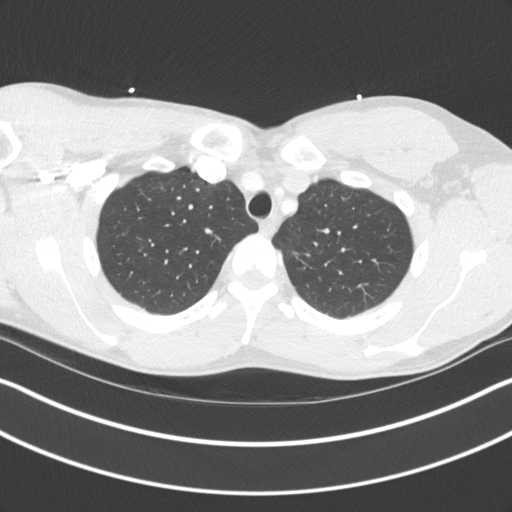
[im 141/166  lung]
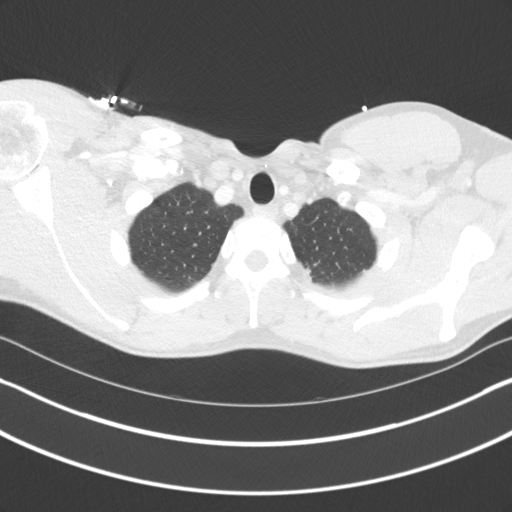
[im 153/166  lung]
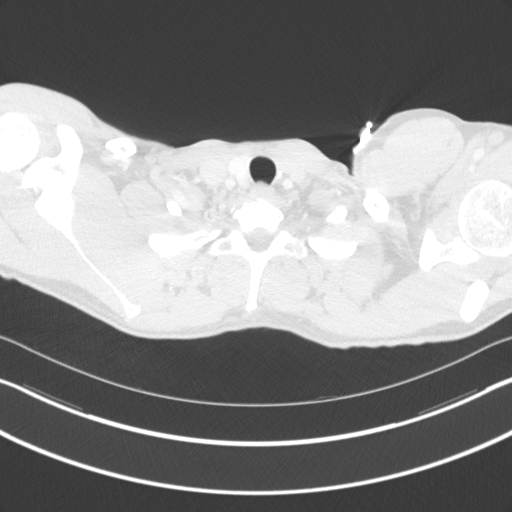

[Series 5: coronal · coronal · 0.69mm/px · 3 of 114 slices shown]
[im 23/114  lung]
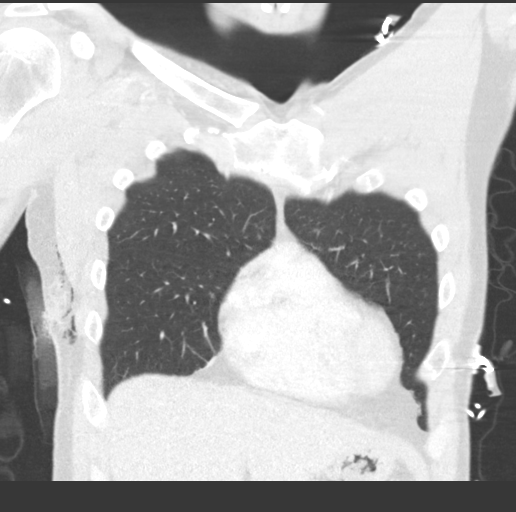
[im 46/114  lung]
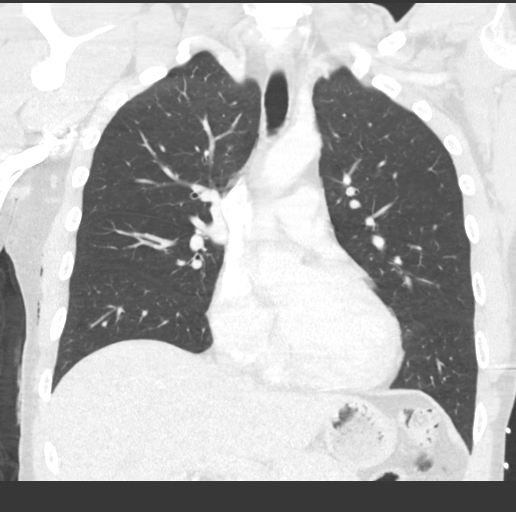
[im 68/114  lung]
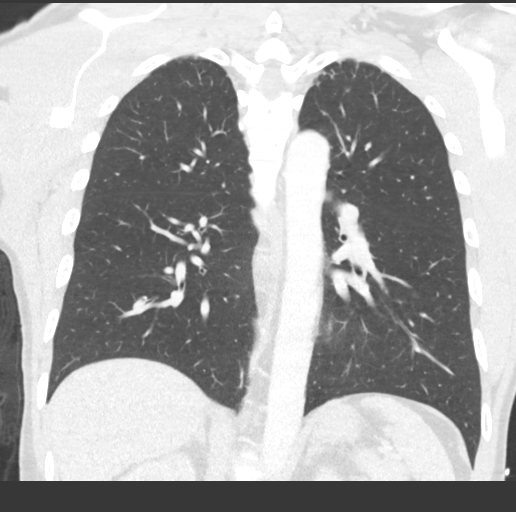

[15 of 36 positions shown; findings below may reference images not displayed]

FINDINGS: Cardiovascular: The heart is normal in size. The thoracic aorta is
normal in caliber. There is no evidence of great vessel injury.

Mediastinum/Nodes: No enlarged mediastinal or hilar lymph nodes.
Surgical clips in the lower neck related to prior partial
thyroidectomy.

Lungs/Pleura: No pleural effusion or pneumothorax. Minimal scarring
in the lung apices. 9 x 5 mm (mean 7 mm) nodule in the medial left
lower lobe (series 3, image 91). Major airways are patent.

Upper Abdomen: Unremarkable.

Musculoskeletal: Soft tissue injury to the right anterolateral chest
wall with subcutaneous stranding/contusion and moderate volume
subcutaneous emphysema. No radiopaque foreign body identified. No
fracture is identified.
IMPRESSION: 1. Soft tissue injury to the right chest wall with subcutaneous
emphysema and contusion.
2. No fracture or evidence of acute lung or vascular injury.
